# Patient Record
Sex: Female | Born: 1940 | Race: White | Hispanic: No
Health system: Southern US, Community
[De-identification: ages and names within clinical notes are randomized; demographics above are authoritative.]

---

## 2002-04-09 ENCOUNTER — Ambulatory Visit (HOSPITAL_COMMUNITY): Admission: RE | Admit: 2002-04-09 | Discharge: 2002-04-09 | Payer: Self-pay | Admitting: Internal Medicine

## 2002-06-25 ENCOUNTER — Ambulatory Visit (HOSPITAL_COMMUNITY): Admission: RE | Admit: 2002-06-25 | Discharge: 2002-06-25 | Payer: Self-pay | Admitting: Orthopaedic Surgery

## 2002-06-25 ENCOUNTER — Encounter: Payer: Self-pay | Admitting: Orthopaedic Surgery

## 2006-03-19 ENCOUNTER — Encounter: Payer: Self-pay | Admitting: Internal Medicine

## 2006-03-28 ENCOUNTER — Ambulatory Visit (HOSPITAL_COMMUNITY): Admission: RE | Admit: 2006-03-28 | Discharge: 2006-03-28 | Payer: Self-pay | Admitting: Internal Medicine

## 2006-03-28 ENCOUNTER — Encounter (INDEPENDENT_AMBULATORY_CARE_PROVIDER_SITE_OTHER): Payer: Self-pay | Admitting: Specialist

## 2006-04-12 ENCOUNTER — Encounter: Payer: Self-pay | Admitting: Interventional Radiology

## 2007-03-13 IMAGING — XA IR KYPHOPLASTY LUMBAR INIT
1 series · 12 of 24 positions shown · IV contrast (agent unspecified)
Comparison: none

CLINICAL DATA: Severe low back pain secondary to compression fracture at L2-3. 
KYPHOPLASTY AT L2:
Following the full explanation of the procedure, along with the potentially associated complications, an informed witnessed consent was obtained. 
The lumbar region was prepped and draped in the usual sterile fashion.  The skin overlying the right pedicle at L2 was infiltrated with 0.25% bupivacaine and extended into the right pedicle. 
Using biplane intermittent fluoroscopy, an 11-gauge Jamshidi needle was advanced through the right pedicle into the posterior one-third at L2.  This was then exchanged for a KyphX advanced osteo introducer system comprised of a working cannula and a KyphX osteo drill over a KyphX osteo bone pin.  This combination was then advanced until the tip of the KyphX osteo drill was in the posterior third at L2.  At this time, the bone pin was removed.  In a median trajectory, the combination was advanced until the tip of the working cannula was inside the posterior one-third at L2.  The KyphX osteo drill was removed, and a core sample was sent for pathologic analysis. 
Through the working cannula, a KyphX bone biopsy device was then advanced to within 5 mm of the anterior aspect of L2.  Crossing of the midline was noted by the tip of the KyphX bone biopsy device.  Through the working cannula, a KyphX inflatable bone tamp 20 x 3 was advanced and positioned such that the distal marker was 5 mm from the anterior aspect of L2.  This was then inflated using Kyphon inflation syringe device via microtubing with contrast.  Inflation was continued until there was apposition with the superior endplate.  Again, crossing of the midline was noted by the balloon.  At this time, methylmethacrylate mixture was reconstituted with tobramycin in the Kyphon bone mixing device system.  This was then loaded onto KyphX bone fillers. 
The balloon was deflated and removed.  Approximately 3.5 bone filler equivalents of methylmethacrylate mixture was instilled at L2 with excellent filling in the AP and lateral projections.  No extravasation was noted into the disk spaces or posteriorly into the spinal canal.  No evidence of epidural extension was noted.  
The bone filler and the working cannula were then retrieved and removed.  Hemostasis was achieved over the overlying skin.

[Series 1: run · 12 of 26 slices shown]
[im 2/26]
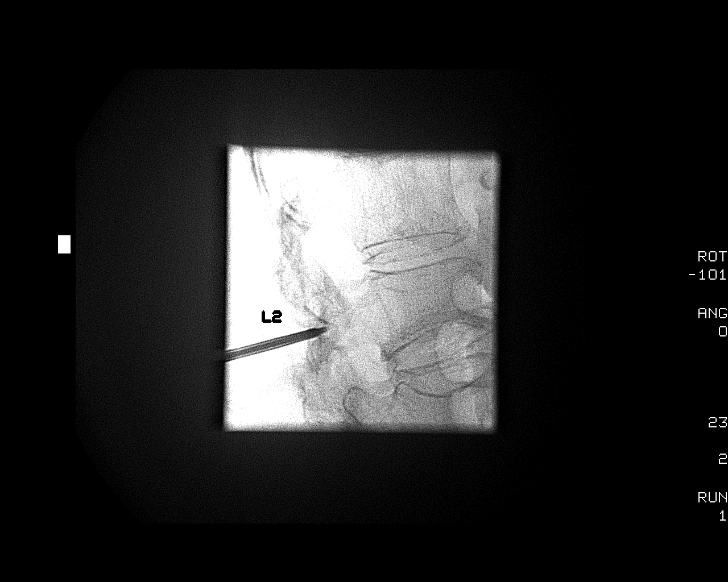
[im 4/26]
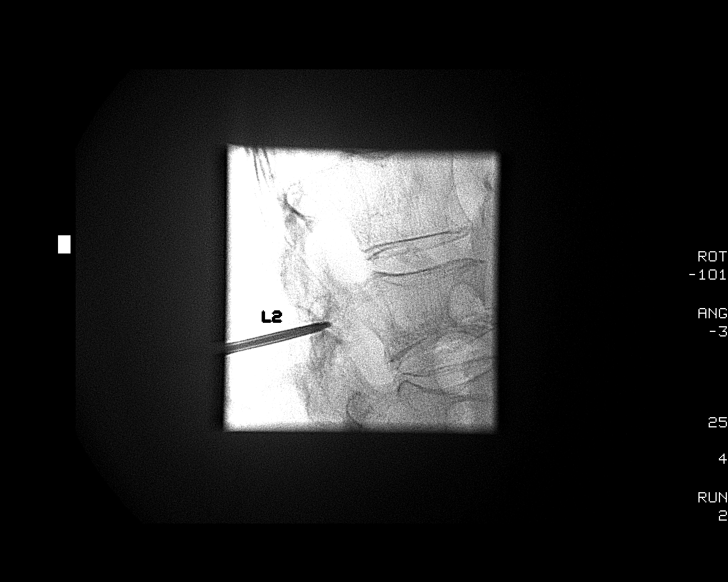
[im 6/26]
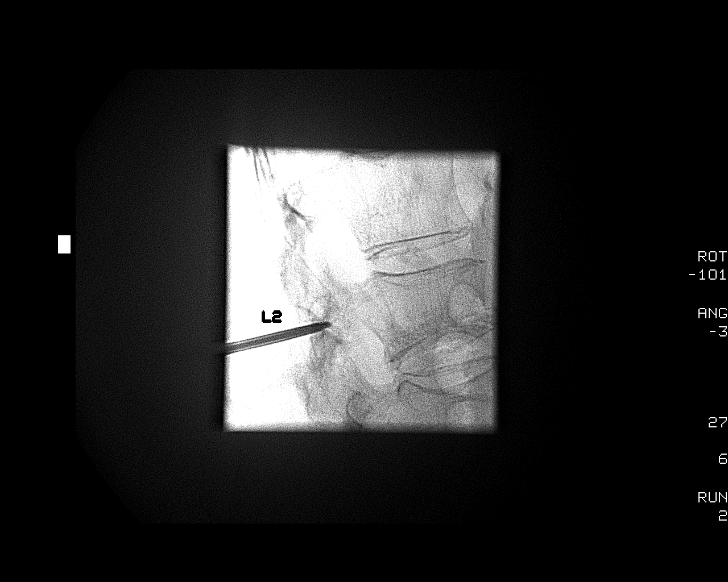
[im 8/26]
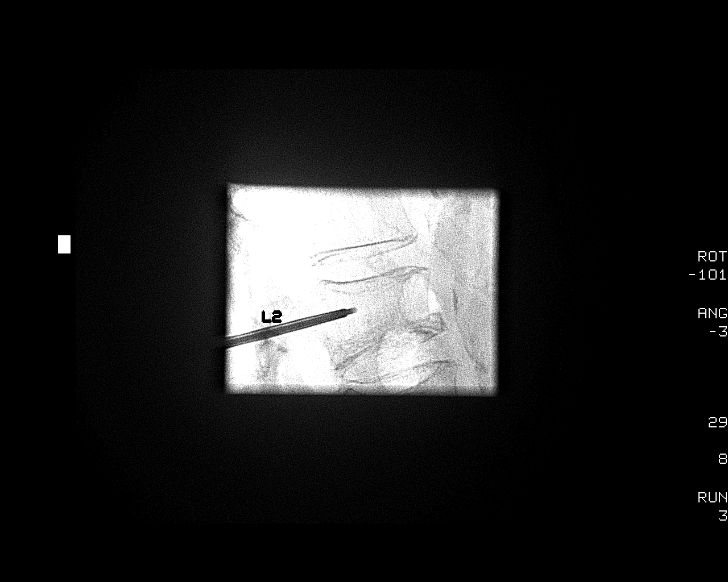
[im 10/26]
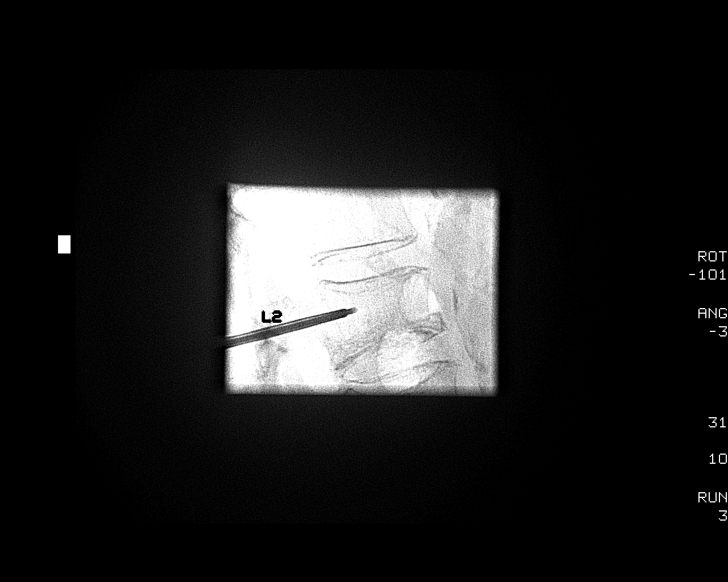
[im 12/26]
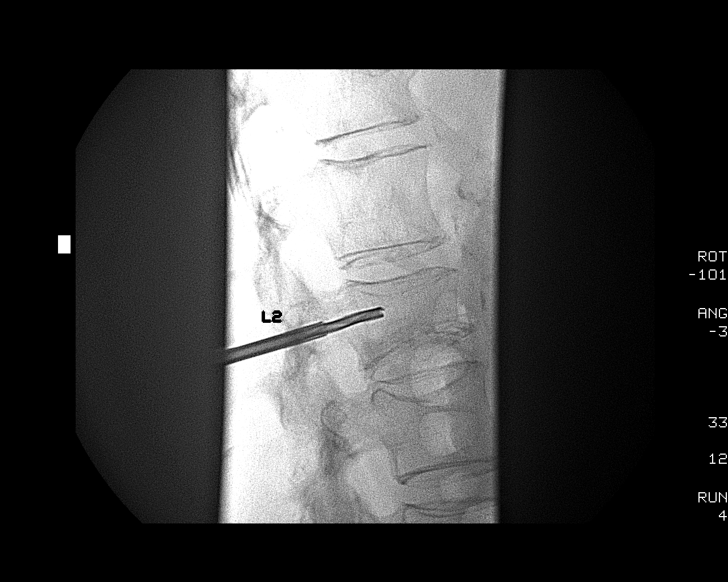
[im 15/26]
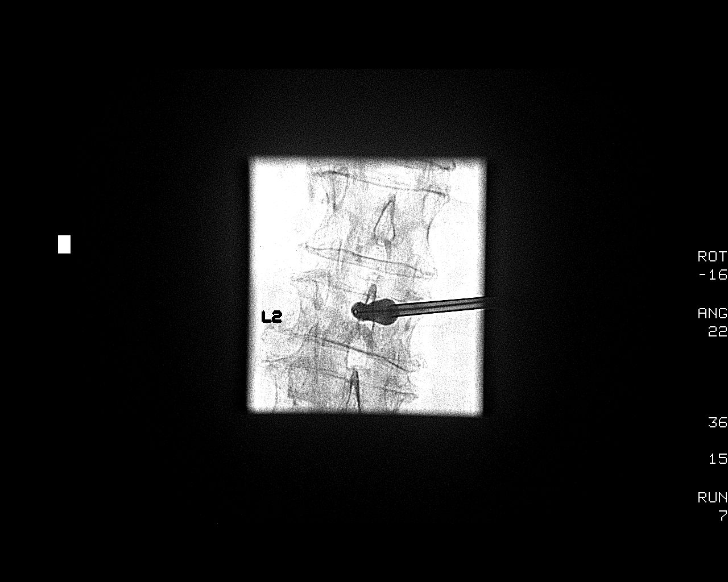
[im 17/26]
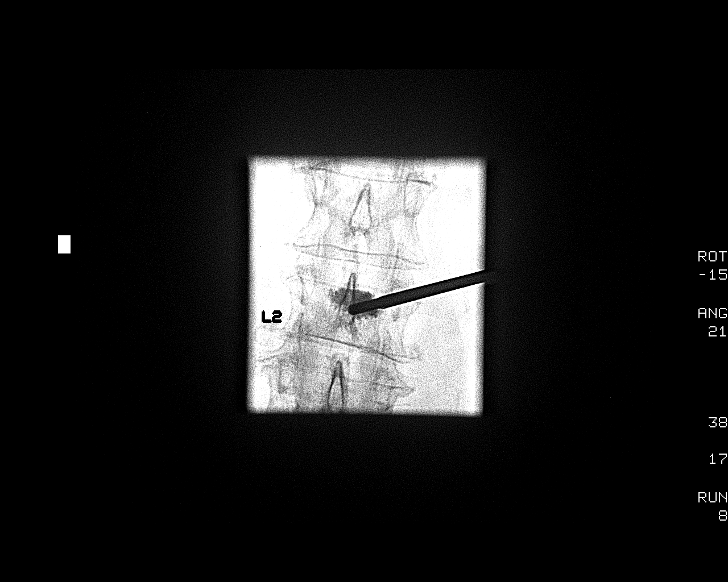
[im 19/26  full-range]
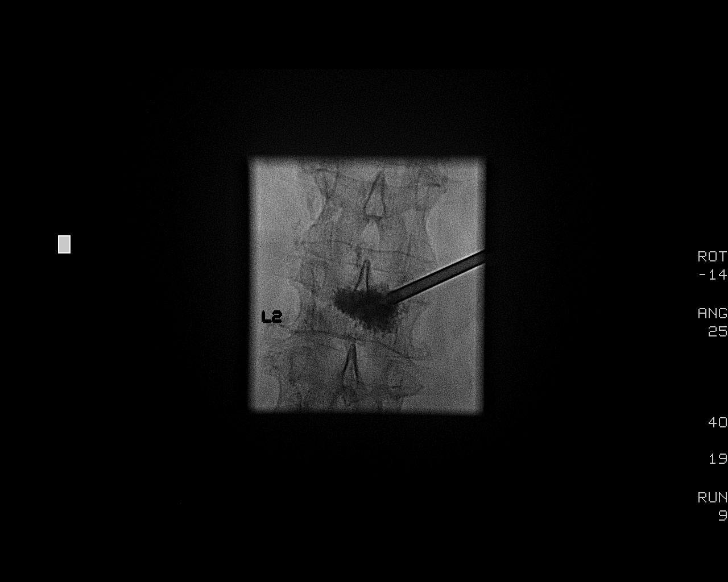
[im 21/26]
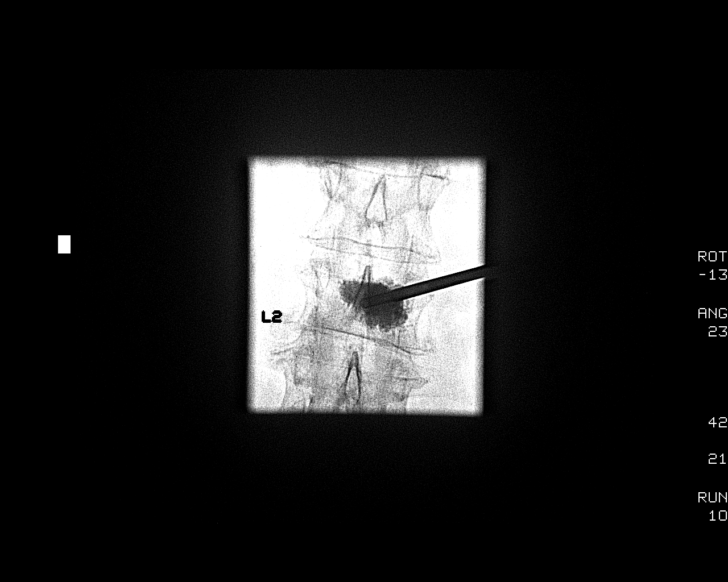
[im 23/26]
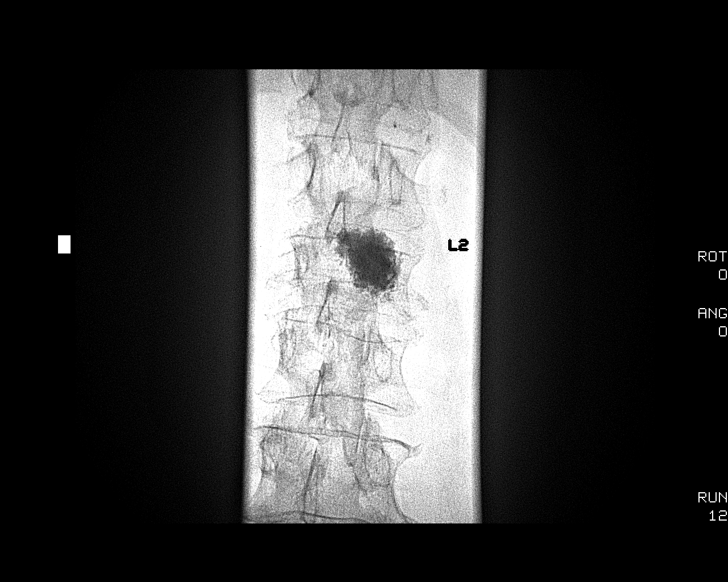
[im 26/26]
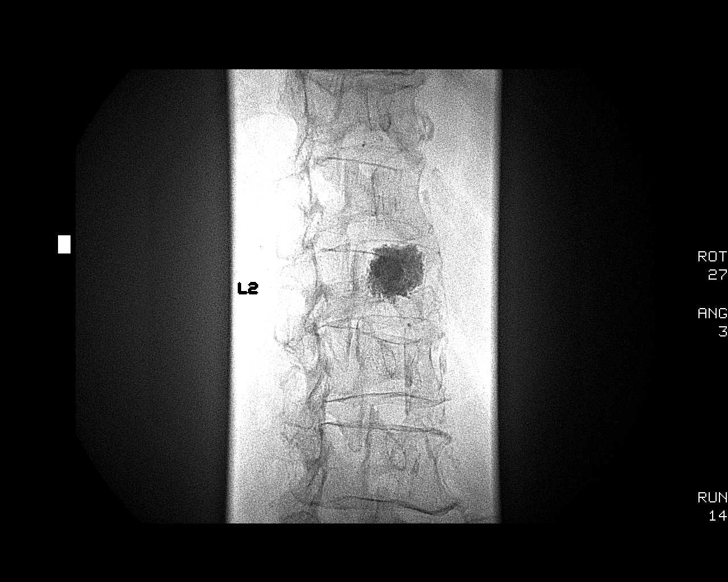

[12 of 24 positions shown; findings below may reference images not displayed]

IMPRESSION: 1.  Status post fluoroscopically guided needle placement for deep core bone biopsy at L2. 
2.  Status post vertebral body augmentation for painful osteoporotic compression fracture at L2 using kyphoplasty technique without event.

## 2009-06-02 ENCOUNTER — Ambulatory Visit: Payer: Self-pay | Admitting: Vascular Surgery

## 2009-06-25 ENCOUNTER — Observation Stay (HOSPITAL_COMMUNITY): Admission: RE | Admit: 2009-06-25 | Discharge: 2009-06-27 | Payer: Self-pay | Admitting: Vascular Surgery

## 2009-06-27 ENCOUNTER — Ambulatory Visit: Payer: Self-pay | Admitting: Vascular Surgery

## 2009-07-28 ENCOUNTER — Ambulatory Visit: Payer: Self-pay | Admitting: Vascular Surgery

## 2010-01-11 ENCOUNTER — Ambulatory Visit: Payer: Self-pay | Admitting: Vascular Surgery

## 2010-11-16 LAB — GLUCOSE, CAPILLARY
Glucose-Capillary: 100 mg/dL — ABNORMAL HIGH (ref 70–99)
Glucose-Capillary: 109 mg/dL — ABNORMAL HIGH (ref 70–99)
Glucose-Capillary: 122 mg/dL — ABNORMAL HIGH (ref 70–99)
Glucose-Capillary: 151 mg/dL — ABNORMAL HIGH (ref 70–99)
Glucose-Capillary: 232 mg/dL — ABNORMAL HIGH (ref 70–99)
Glucose-Capillary: 275 mg/dL — ABNORMAL HIGH (ref 70–99)

## 2010-11-16 LAB — POCT I-STAT, CHEM 8
BUN: 19 mg/dL (ref 6–23)
Calcium, Ion: 1.14 mmol/L (ref 1.12–1.32)
Chloride: 99 mEq/L (ref 96–112)
Creatinine, Ser: 1 mg/dL (ref 0.4–1.2)
Glucose, Bld: 88 mg/dL (ref 70–99)
HCT: 37 % (ref 36.0–46.0)
Hemoglobin: 12.6 g/dL (ref 12.0–15.0)
Potassium: 4.2 mEq/L (ref 3.5–5.1)
Sodium: 140 mEq/L (ref 135–145)
TCO2: 34 mmol/L (ref 0–100)

## 2010-12-27 NOTE — Assessment & Plan Note (Signed)
OFFICE VISIT   Lisa Riggs, Lisa Riggs  DOB:  03/26/1941                                       07/28/2009  JYNWG#:[id number]   Patient returns for follow-up today after bilateral common iliac  stenting on 06/28/2009.  She states that she feels her walking distance  has improved significantly.  She denies any claudication symptoms at  this point.  She is limited somewhat by her breathing, as she becomes  short of breath with minimal exertion.  Overall, however, she feels her  lower extremities are improved.   She has had multiple joint arthritis which also limits her.   Chronic medical problems include diabetes, hypertension, COPD, all of  which are well-controlled currently.   On physical exam today, blood pressure is 121/68 in the right arm, heart  rate 76 and regular.  Oxygen saturation is 95%, respirations are 20.  HEENT is unremarkable.  CHEST:  Clear to auscultation.  CARDIAC:  Regular rate rhythm.  She has 2+ femoral and 2+ dorsalis pedis pulses  bilaterally.   She is currently still on Plavix and should remain on this for a total  of 6 weeks, then she could switch back to aspirin alone.   Overall, patient seems to be doing well at this point and has had relief  of her claudication-type symptoms.  She will follow up in our lab  protocol, and her next scan of her stents and ABIs will be in 6 months.  Today's ABIs were 1.11 on the right, 1.01 on the left, both of which are  improved from 0.5 previously.     Janetta Hora. Fields, MD  Electronically Signed   CEF/MEDQ  D:  07/30/2009  T:  07/30/2009  Job:  [id number]   cc:   Doreen Beam, MD

## 2010-12-27 NOTE — Procedures (Signed)
AORTA-ILIAC DUPLEX EVALUATION   INDICATION:  Bilateral common iliac stents.   HISTORY:  Diabetes:  Yes  Cardiac:  Irregular heart beat  Hypertension:  Yes  Smoking:  No  Previous Surgery:  Bilateral common iliac stents.               SINGLE LEVEL ARTERIAL EXAM                              RIGHT                  LEFT  Brachial:                  181                    178  Anterior tibial:           176                    158  Posterior tibial:          255/noncompressible    194  Peroneal:  Ankle/brachial index:      0.97                   1.07  Previous ABI/date: 07/14/2009                     1.11  1.01   AORTA-ILIAC DUPLEX EXAM  Aorta - Proximal     112 cm/s  Aorta - Mid          159 cm/s  Aorta - Distal       195 cm/s   RIGHT                                   LEFT  233 cm/s          CIA-PROXIMAL          209 cm/s  124 cm/s          CIA-DISTAL            174 cm/s                    HYPOGASTRIC  197 cm/s          EIA-PROXIMAL          234 cm/s  187 cm/s          EIA-MID               141 cm/s  143 cm/s          EIA-DISTAL            126 cm/s    IMPRESSION:  1. Bilateral common iliac artery stents appear to be patent.  2. Limited exam due to increased body surface area and increased bowel      gas pattern.  3. Normal ankle-brachial indices with triphasic and biphasic wave      forms.        ___________________________________________  Janetta Hora Fields, MD   NT/MEDQ  D:  01/11/2010  T:  01/11/2010  Job:  [id number]

## 2010-12-27 NOTE — Assessment & Plan Note (Signed)
OFFICE VISIT   Lisa Riggs, Lisa Riggs  DOB:  Apr 08, 1941                                       06/02/2009  ZOXWR#:[id number]   CHIEF COMPLAINT:  Leg weakness.   HISTORY OF PRESENT ILLNESS:  The patient is a 70 year old female who  develops weakness in her legs after taking approximately 5-10 steps.  This has become slowly progressive over the last year.  Of note, she  also has multiple joint arthritis.  She is also obese.  She is on home  oxygen of continuous 2 liters.  She has been on oxygen for 2 years.  She  denies rest pain or ulcerations on the feet.  The weakness is improved  somewhat by stopping walking and resting.   PAST SURGICAL HISTORY:  Hysterectomy, cholecystectomy, left shoulder  operation, left elbow operation, back surgery.   PAST MEDICAL HISTORY:  Diabetes, hypertension and COPD.   FAMILY HISTORY:  Is remarkable for her father who had vascular disease  at age less than 84.   SOCIAL HISTORY:  She is widowed and has three children.  She is a former  smoker but smoked heavily up until 9 years ago.   REVIEW OF SYSTEMS:  The patient states she has had some weight loss and  loss of appetite recently.  She is 5 feet, 218 pounds.  CARDIAC:  She has shortness of breath with exertion and at rest.  PULMONARY:  She is on home oxygen.  She has a chronic productive cough  and bronchitis.  She also has occasional wheezing.  GI:  Is negative.  RENAL:  She has urinary frequency.  VASCULAR:  She denies history of stroke or TIA.  NEUROLOGIC:  She has occasional dizziness.  ORTHOPEDIC:  She has multiple joint arthritis pain and muscle pain.  SKIN:  She has no rashes.  PSYCHIATRIC:  She has a history of anxiety and depression.  ENT:  She has had some recent decline in her eyesight.  She also has had  some decline in her hearing.  HEMATOLOGIC:  She denies difficulty stopping bleeding.   MEDICATIONS:  1. NovoLog sliding scale.  2. Aspirin 81 mg once a  day.  3. Effexor XR 150 mg once a day.  4. Glucotrol XL 5 mg once a day.  5. Zetia 10 mg once a day.  6. Aricept 10 mg once a day.  7. Allegra 180 mg once a day.  8. Flonase 2 puffs once a day.  9. Advair 1 puff twice a day.  10.Hydrocodone 5/500 one tablet every 4-6 hours p.r.n.  11.Darvocet 1 tablet twice a day p.r.n.  12.Desyrel 100 mg 1-2 tablets q.h.s. for sleep.  13.DuoNeb q.4 hours p.r.n.  14.Robitussin q.4 hours p.r.n.  15.Oxygen 2 liters continuous nasal cannula.  16.Phenergan 25 mg 4 times a day p.r.n.  17.Norvasc 5 mg once in the morning and 2.5 mg in the evening.  18.Xanax 1 mg t.i.d.  19.Diovan 325 mg once a day.  20.Lantus insulin 63 units twice a day.  21.Ferrous sulfate 325 mg once a day.  22.Midrin capsule p.r.n. headache.  23.Nasonex nasal spray p.r.n.  24.Proventil inhaler 4 times a day p.r.n. wheezing.  25.Aplenzin 522 mg once a day.  26.Namenda 10 mg twice a day.  27.Flexeril 10 mg q.h.s. p.r.n.  28.Prednisone 20 mg twice daily.  29.Lasix 20 mg p.r.n.  30.Potassium 10 mEq p.r.n.   ALLERGIES:  Penicillin causes a rash.  Erythromycin causes a rash.   PHYSICAL EXAMINATION:  Vital signs:  Blood pressure is 183/72 in the  right arm, pulse is 80 and regular.  Respirations are 20.  Chest:  Is  clear to auscultation.  Cardiac:  Regular rate and rhythm without  murmur.  Abdomen:  Is very obese, soft, nontender, nondistended.  No  masses.  Neck:  She has 2+ carotid pulses without bruit bilaterally.  She has no cervical lymphadenopathy.  Extremities:  She has absent  femoral and popliteal and pedal pulses bilaterally.  Neurological:  She  is alert and oriented x3.  Upper extremity and lower extremity motor  strength is 4/5 and symmetric bilaterally.   I reviewed her CT angiogram from [facility] dated 05/05/2009.  There is calcification near the origin of both common iliac arteries  with most likely high-grade stenosis of these.   She had bilateral ABIs  performed today which were 0.54 on the right  side, 0.48 on the left.  Waveforms were monophasic.   In summary, the patient has some elements of claudication in her lower  extremities.  She may have high-grade stenosis of her common iliac  arteries bilaterally.  This is consistent with her physical exam and  also recent CT angio.  I believe the best option for her would be  arteriogram, possible bilateral iliac angioplasty and stenting; if the  lesion is too heavily calcified to be amenable to percutaneous  intervention I do not believe she is an open operative candidate due to  her multiple comorbidities.  I discussed this with the patient and her  son in the office today.  Risks, benefits, possible complications and  procedure details of angioplasty and stenting were discussed with the  patient and her family including but not limited to bleeding, infection  and vessel injury.  She understands and agrees to proceed.  Angiogram is  scheduled for 06/25/2009.   Janetta Hora. Fields, MD  Electronically Signed   CEF/MEDQ  D:  06/03/2009  T:  06/04/2009  Job:  [id number]   cc:   Doreen Beam, MD

## 2010-12-30 NOTE — Consult Note (Signed)
NAMEDENECE, SHEARER              ACCOUNT NO.:  [id number]   MEDICAL RECORD NO.:  [id number]          PATIENT TYPE:  OUT   LOCATION:  XRAY                         FACILITY:  [facility]   PHYSICIAN:  Sanjeev K. Deveshwar, M.D.DATE OF BIRTH:  05-19-1941   DATE OF CONSULTATION:  03/19/2006  DATE OF DISCHARGE:                                   CONSULTATION   DATE OF CONSULT:  March 21, 2006.   CHIEF COMPLAINT:  Back pain.   HISTORY OF PRESENT ILLNESS:  This is a 70 year old female referred to Dr.  Corliss Skains through the courtesy of Dr. Doreen Beam.  The patient has  experienced multiple falls.  Her last one was in June of this year. She has  had back pain since that time. An MRI was performed at [facility].  This showed an old fracture at the T12 level as well as a newer fracture at  the L2 level.   The patient presents today accompanied by her pastor to discuss further  treatment options and recommendations with Dr. Corliss Skains.  The patient did  give permission to discuss the specifics of her medical history in front of  her pastor.   Dr. Corliss Skains reviewed the results of the recent MRI performed at [facility].  He pointed out the old fracture at T12 as well as the new  fracture at L2.  There was also some disk protrusion at L4-L5.  Dr.  Corliss Skains felt that the patient would be a candidate for an L2 kyphoplasty  or possibly a vertebroplasty.   PAST MEDICAL HISTORY:  Is significant for:  1.  Diabetes mellitus.  2.  Hyperlipidemia.  3.  Hypertension.  4.  Coronary artery disease.  5.  Atrial fibrillation.  6.  The patient was evaluated by Dr. Grace Isaac in [place] in 1981 or 1982.      Apparently she had an MI at that time and had what sounds like an      angioplasty.  7.  She does have a history of depression.  8.  History of osteoarthritis.  9.  Degenerative joint disease.  10. Osteoporosis.  11. She had uterine cancer and has had a hysterectomy.  12. She has a  remote history of peptic ulcer disease.  13. She has a long history of tobacco use and probable COPD.   SURGICAL HISTORY:  Significant for:  1.  Cholecystectomy.  2.  Hysterectomy.  3.  BSO.  4.  Rotator cuff surgery in 1991.  5.  Bladder surgery in 1990.   She denies any previous problems with anesthesia.   ALLERGIES:  PENICILLIN.  She denies allergies to contrast dye, latex, shrimp  or iodine.   CURRENT MEDICATIONS:  Include Zetia, triamterene, hydrochlorothiazide,  alprazolam, Effexor, Ecotrin, Risperdal, insulin and Vicodin.   SOCIAL HISTORY:  The patient is widowed.  She has three children.  She lives  alone in [place].  She quit smoking 3 years ago.  She did smoke one to one  and one-half pack of cigarettes per day for 52 years.  She does not use  alcohol.  She is  retired from a [employer]   FAMILY HISTORY:  Her mother died at age 85.  The specifics are unknown.  Her  father died at age 39 from lung cancer.   IMPRESSION AND PLAN:  As noted, the patient presents today with her pastor  to discuss treatment options for her L2 compression fracture which occurred  after she fell while getting out of the shower.  Dr. Corliss Skains reviewed the  results of the MRI performed at [facility] with the patient and her  pastor. He felt that she would be a candidate for a kyphoplasty at the L2  level if there has not been further collapse in the interim.  If the  fracture has progressed, she may need a vertebroplasty instead.  The risks  and benefits of the procedure were discussed as well as the specifics of the  procedure.  All of their questions were answered. Treatment options  including conservative therapy as well as vertebroplasty and kyphoplasty  procedures were discussed.  The patient has elected to proceed with the  kyphoplasty procedure. This been scheduled for this coming Tuesday.   Greater than 40 minutes was spent on this consultation.      Delton See,  P.A.    ______________________________  Grandville Silos. Corliss Skains, M.D.    DR/MEDQ  D:  03/19/2006  T:  03/19/2006  Job:  [id number]   cc:   Doreen Beam, M.D.  Dr.  Kandice Robinsons

## 2010-12-30 NOTE — H&P (Signed)
[name], Riggs                          ACCOUNT NO.:  [id number]   MEDICAL RECORD NO.:  [id number]                   PATIENT TYPE:   LOCATION:                                       FACILITY:   PHYSICIAN:  Lionel December, M.D.                 DATE OF BIRTH:   DATE OF ADMISSION:  DATE OF DISCHARGE:                                HISTORY & PHYSICAL   DATE OF ADMISSION:  14-May-1941.   PRESENTING PROBLEM:  Lower abdominal pain with diarrhea and tarry stools.   HISTORY OF PRESENT ILLNESS:  Lisa Riggs is a 70 year old Caucasian female  who was self-referred for GI evaluation. Eight or nine weeks ago, she had an  episode of diarrhea with black stools but this subsided. She also has been  experiencing lower abdominal pain. At one point, she was felt to be  constipated and took Magnesium Citrate and Mineral Oil. However, this is not  a problem anymore. She had diarrhea every day for several days. She states  that her stools and black intermittently. She complains of pain in both  iliac fossa. She also has frequent heartburn, nausea, and intermittent  vomiting, although she has not had any hematemesis. She says that her  appetite is poor but she has not lost any weight recently. She also has  experienced intermittent light headedness over the last three to four weeks.  She denies exertional dyspnea, chest pain, dysphagia, or sense of chronic  cough. She complains of severe back pain. She states that she has more or  less generalized joint pains.   MEDICATIONS:  1. Prozac 20 mg bid.  2. Naprosyn 500 mg bid.  3. Lodine 300 mg bid.  4. Darvocet N-100 as needed.  5. Atia 10 mg QD.  6. ASA 325 mg QD.  7. Potassium 500 mg QD.  8. Calcium 500 mg QD.  9. Phenergan suppository 25 mg bid as needed.   PAST MEDICAL HISTORY:  1. She has had problems with depression for about sixteen years, well     controlled with therapy.  2. Generalized osteoarthrosis.  3. Degenerative joint  disease of the lumbar spine.  4. She is presently using a TENS unit.  5. Osteoporosis.  6. History of peptic ulcer disease with bleeding in 1989. She does not     recall that she was transfused.  7. Hypertension.  8. Diabetes mellitus for about five years.  9. The patient was seen in our office in July 1999 by Dr. Jena Gauss for     constipation and fecal seepage. She responded to therapy.  10.      The patient did have a contrast barium enema in June of 1990 by Dr.     Danton Sewer that was within normal limits.   PAST SURGICAL HISTORY:  1. Hysterectomy at age 56 years for early uterine carcinoma.  2. She had bilateral salpingo-oophorectomy  in 1982.  3. Cholecystectomy in 1990.  4. Repair of left rotator cuff tear in 1991.  5. She says she has had her bladder tacked up six different times, the last     being in 1990. Presently she does not have any problems.   ALLERGIES:  PENICILLIN.   FAMILY HISTORY:  Both parents are deceased. Father died of lung carcinoma.  Mother lived to be in her old age. One sister has had bilateral mastectomy  for carcinoma. The other two are in fair health. She has two brothers in  fair health.   SOCIAL HISTORY:  She is a widow with three children. She works in the  [employer]. She is now retired. She does not drink alcohol. She has been  smoking for several years. Now down to one pack per week or ten days.   PHYSICAL EXAMINATION:  GENERAL: Pleasant, well developed, well nourished,  Caucasian female who is in no acute distress.  VITAL SIGNS: Weight 178 pounds. Height 5'1. Pulse 68, blood pressure  110/60, temperature 97.7.  HEENT: Conjunctiva pink. Sclera nonicteric. Oral pharyngeal mucosa is  normal. She has upper dental plates.  NECK: No masses or thyromegaly. Carotids are 2+ bilaterally and without  bruits.  HEART: Regular rate and rhythm.  Normal S1 and S2. No murmur, rub, or  gallop.  LUNGS:  Clear to auscultation and percussion.  ABDOMEN:  Normal bowel sounds. Soft abdomen with mild tenderness in both  iliac fossi and also posterior upper abdomen. No organomegaly or masses  noted. She has scant amount of stool in the vault which is guaiac negative.  No clubbing, cyanosis, or edema.   ASSESSMENT:  Lisa Riggs is a 70 year old Caucasian female who presents  with a two week history of diarrhea with intermittent tarry stools and lower  abdominal pain. She also has had frequent heartburn, nausea, and  intermittent vomiting. Her stool, however, is guaiac negative today. It is  interesting to know that she is on two different NSAIDs at a fairly high  dose. I suspect that she could have peptic ulcer disease but given the lower  abdominal pain and tenderness, she could also have NSAID colopathy. She is  also on ASA which increased the risk of GI complications several fold.   RECOMMENDATIONS:  1. She will have CBC and H-pylori serology with the rest of her blood work     Advertising account executive.  2. I have asked the patient to discontinue her Lodine and Naprosyn but stay     on ASA for now.  3. Will start on Prevacid 30 mg po every a.m., first dose today and samples     given.  4. She will undergo esophagogastroduodenoscopy followed by total colonoscopy     at [facility] next week.  5. I will review the procedures and alternatives with the patient and her     son. She is agreeable. She has requested     that medical information can be shared with her son, Lisa Riggs,     or daughter-in-law.  6. If she develops symptoms of frank melena or hematemesis, she will need to     come to the emergency room at [facility].  Lionel December, M.D.    NR/MEDQ  D:  04/03/2002  T:  04/04/2002  Job:  [id number]   cc:   Marilu Favre B. Wyn Quaker, M.D.  [place], [place]

## 2010-12-30 NOTE — Assessment & Plan Note (Signed)
OFFICE VISIT   PARISSA, CHIAO  DOB:  [birth year]                                       06/30/2009  CVELF#:[id number]   The patient underwent arteriogram with bilateral common iliac artery  stenting on 06/26/2009.  The procedure was without complication.  She  will need a followup in 6 months' time for repeat ABIs.  She will be on  Plavix for 6 weeks and then switch to regular aspirin after that point.   Janetta Hora. Fields, MD  Electronically Signed   CEF/MEDQ  D:  06/30/2009  T:  07/01/2009  Job:  [id number]   cc:   Doreen Beam, MD

## 2010-12-30 NOTE — Consult Note (Signed)
NAMECOPELAND, LAPIER              ACCOUNT NO.:  [id number]   MEDICAL RECORD NO.:  [id number]          PATIENT TYPE:  OUT   LOCATION:  XRAY                         FACILITY:  [facility]   PHYSICIAN:  Sanjeev K. Deveshwar, M.D.DATE OF BIRTH:  07-24-1941   DATE OF CONSULTATION:  04/12/2006  DATE OF DISCHARGE:                                   CONSULTATION   DATE OF CONSULTATION:  April 12, 2006.   BRIEF HISTORY:  This is a 70 year old female who was referred to Dr.  Corliss Skains through the courtesy of Dr. Doreen Beam for evaluation of back  pain.  An MRI was performed at [facility] that showed an old fracture  at T12 and a newer fracture at L2.  The patient was seen in consultation by  Dr. Corliss Skains on March 19, 2006 and on March 28, 2006 underwent a  kyphoplasty at the L2 level.  She returns today accompanied by her son to be  seen in follow-up.   PAST MEDICAL HISTORY:  1. Diabetes mellitus.  2. Hyperlipidemia.  3. Hypertension.  4. Coronary artery disease.  5. Atrial fibrillation.  6. History of depression as well as anxiety.  7. Osteoarthritis.  8. Osteoporosis.  9. Status post hysterectomy for uterine cancer.  10.She has a remote history of peptic ulcer disease.  11.She has a long history of tobacco use.  12.Probable chronic obstructive pulmonary disease.   PAST SURGICAL HISTORY:  1. Cholecystectomy.  2. Hysterectomy.  3. BSO rotator cuff surgery.  4. Bladder surgery.   ALLERGIES:  PENICILLIN.   MEDICATIONS:  1. Zetia.  2. Triamterene.  3. Hydrochlorothiazide.  4. Alprazolam.  5. Effexor.  6. Ecotrin.  7. Risperdal.  8. Insulin.  9. Vicodin.  10.Darvocet.   SOCIAL HISTORY:  The patient is widowed.  She has three children.  She lives  alone in [place], [place].  She quit smoking three years ago.  She did  smoke 1-1-1/2 pack cigarettes per day for 52 years.  She does not use  alcohol.  She is retired from a Veterinary surgeon.   FAMILY HISTORY:  Mother died at  age 6.  The specifics are unknown.  Her  father died at age 76 from lung cancer.   IMPRESSION AND PLAN:  As noted, the patient returns today approximately two  weeks following her kyphoplasty.  She reports her pain is much improved,  although it is not completely gone.  The pain is worse with standing and  ambulation.  She can walk short distances before the pain becomes severe  enough that she has to sit down.  She uses Vicodin rarely for pain.  She  uses Darvocet at bedtime.  This does help with the pain.  She denies any  dyspnea.  She is sleeping okay.  Her appetite is good.   As noted, the patient does feel much improved, although she continues to  have low back pain.  Dr. Corliss Skains reviewed the patient's images from her  kyphoplasty with the patient and her son.  He felt that there was a good  result from the procedure.  He recommended  that she give herself a little  more time to heal.  He recommended another 1-2 weeks following which she  felt she would be much improved.  If the pain continued or if the pain  became worse or she developed new pain, he recommended that she contact us  immediately.   Greater than 15 minutes is spent on this consult.      Delton See, P.A.    ______________________________  Grandville Silos. Corliss Skains, M.D.    DR/MEDQ  D:  04/12/2006  T:  04/13/2006  Job:  [id number]   cc:   Marlowe Alt, M.D.

## 2010-12-30 NOTE — Op Note (Signed)
   NAME:  Lisa Riggs, Lisa Riggs                        ACCOUNT NO.:  [id number]   MEDICAL RECORD NO.:  [id number]                   PATIENT TYPE:  AMB   LOCATION:  DAY                                  FACILITY:  [facility]   PHYSICIAN:  Lionel December, M.D.                 DATE OF BIRTH:  1941-06-18   DATE OF PROCEDURE:  04/09/2002  DATE OF DISCHARGE:                                 OPERATIVE REPORT   ADDENDUM:  The patient is allergic to penicillin, therefore, amoxicillin  will be substituted with Flagyl, and it will be for 2 weeks.                                               Lionel December, M.D.    NR/MEDQ  D:  04/09/2002  T:  04/11/2002  Job:  [id number]

## 2010-12-30 NOTE — Op Note (Signed)
NAME:  Lisa Riggs, Lisa Riggs                        ACCOUNT NO.:  [id number]   MEDICAL RECORD NO.:  [id number]                   PATIENT TYPE:  AMB   LOCATION:  DAY                                  FACILITY:  [facility]   PHYSICIAN:  Lionel December, M.D.                 DATE OF BIRTH:  08/16/1940   DATE OF PROCEDURE:  04/09/2002  DATE OF DISCHARGE:                                 OPERATIVE REPORT   PROCEDURE:  Esophagogastroduodenoscopy followed by total colonoscopy.   ENDOSCOPIST:  Lionel December, M.D.   INDICATIONS:  This patient is a 70 year old Caucasian female with a history  of tarry stool who has also been experiencing lower abdominal discomfort and  has had change in her bowel habits.  At one point she was constipated and  had diarrhea.  She was seen in the office last week and I noted that she was  taking 2 different NSAIDS.  These were discontinued.  She was asked to just  take Darvocet-N 100.  She was begun on Prevacid 30 mg q.a.m. A CCB and H.  pylori was checked.  CBC was normal.  Her H&H was 13.1 and 40.1.  H. pylori  serology is positive. She is undergoing diagnostic evaluation.   The procedure and risks were reviewed with the patient and informed consent  was obtained.   PREOPERATIVE MEDICATIONS:  Cetacaine spray for pharyngeal topical  anesthesia, Demerol 50 mg IV and Versed 7 mg IV.   INSTRUMENT:  Olympus video system.   FINDINGS:  Procedure performed in endoscopy suite.  The patient's vital  signs and O2 saturation were monitored during the procedure and remained  stable.   PROCEDURE #1 ESOPHAGOGASTRODUODENOSCOPY:  The patient was placed in the left  lateral recumbent position and the scope was passed via oropharynx without  any difficulty into the esophagus.   ESOPHAGUS:  Mucosa was normal throughout.  Squamocolumnar junction was  unremarkable.  No hernia was noted.   STOMACH:  It was empty and distended very well with insufflation.  Folds in  the proximal stomach  were few, but otherwise unremarkable.  Examination of  the mucosa revealed patchy erythema as well as granularity at the antrum.  There was a scar at the angularis, but no ulcer crater was identified.  The  pyloric channel was patent.  Angularis, fundus, and cardia were examined by  retroflexing the scope and were unremarkable.   DUODENUM:  Examination of the bulb revealed normal mucosal.  The scope was  passed in the second and third part of the duodenum where mucosa and folds  were normal.   The endoscope was withdrawn and the patient was prepared for procedure #2.   TOTAL COLONOSCOPY:  Rectal examination was performed.  This was within  normal limits.  The scope was placed in the rectum and advanced under direct  vision into the sigmoid colon and beyond.  Preparation was satisfactory.  The scope was passed to the cecum which was identified by appendiceal  orifice and the ileocecal valve.  Pictures taken for the record.  __________  was also examined for at least 20 cm and was normal.  As the scope was  withdrawn colonic mucosa was once again carefully examined and mucosa was  normal throughout. There were no polyps, tumor masses, or diverticular  changes.  Rectal mucosa was similarly normal.   The scope was retroflexed to examine the anorectal junction;  and small  hemorrhoids were noted below the dentate line.  The endoscope was  straightened and withdrawn.  The patient tolerated the procedures well.   FINAL DIAGNOSES:  1. Antral gastritis with scarring, but no evidence of active peptic ulcer     disease.  Her gastritis is secondary to Helicobacter pylori infection (HP     is positive).  2. Normal total colonoscopy and terminal ileoscopy except small external     hemorrhoids.  3. 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  Her H&H was     normal and her stool was guaiac negative.  Will not pursue with further     evaluation unless bleeding was to recur.    PLAN:  1. Will treat her with Prevpac for 2 weeks, Bentyl 10 mg t.i.d.  2. She will resume her Prevacid once she finishes Prevpac.  3. She will return for office visit 3 weeks from now.                                               Lionel December, M.D.    NR/MEDQ  D:  04/09/2002  T:  04/11/2002  Job:  [id number]   cc:   Marcelino Duster

## 2012-08-19 ENCOUNTER — Encounter: Payer: Self-pay | Admitting: Vascular Surgery

## 2014-01-12 DEATH — deceased

## 2015-06-25 ENCOUNTER — Telehealth: Payer: Self-pay | Admitting: Pain Medicine

## 2015-06-25 NOTE — Telephone Encounter (Signed)
Mammie RussianNurses,Shatoya, Kathy, and Angie, Please schedule patient for: Greater Occipital Nerve Blocks with Myoneural Blocks of the Cervical Region for  Monday, 07/05/2015

## 2015-06-25 NOTE — Telephone Encounter (Signed)
Having ver bad neck pain / should she come for procedure

## 2015-06-25 NOTE — Telephone Encounter (Signed)
Attempted to call pt, this number is invalid.

## 2015-06-25 NOTE — Telephone Encounter (Signed)
Had pain in neck since yesterday.Do you want to do a procedure?
